# Patient Record
Sex: Male | Born: 1965 | Race: White | Hispanic: No | Marital: Single | Smoking: Current some day smoker
Health system: Southern US, Community
[De-identification: ages and names within clinical notes are randomized; demographics above are authoritative.]

## PROBLEM LIST (undated history)

## (undated) DIAGNOSIS — E7800 Pure hypercholesterolemia, unspecified: Secondary | ICD-10-CM

---

## 2015-11-05 ENCOUNTER — Emergency Department (HOSPITAL_COMMUNITY): Payer: BLUE CROSS/BLUE SHIELD

## 2015-11-05 ENCOUNTER — Encounter (HOSPITAL_COMMUNITY): Payer: Self-pay

## 2015-11-05 ENCOUNTER — Emergency Department (HOSPITAL_COMMUNITY)
Admission: EM | Admit: 2015-11-05 | Discharge: 2015-11-05 | Disposition: A | Discharge status: Home / Self Care | Payer: BLUE CROSS/BLUE SHIELD | Attending: Emergency Medicine | Admitting: Emergency Medicine

## 2015-11-05 DIAGNOSIS — R5383 Other fatigue: Secondary | ICD-10-CM | POA: Insufficient documentation

## 2015-11-05 DIAGNOSIS — E7800 Pure hypercholesterolemia, unspecified: Secondary | ICD-10-CM | POA: Diagnosis not present

## 2015-11-05 DIAGNOSIS — F17200 Nicotine dependence, unspecified, uncomplicated: Secondary | ICD-10-CM | POA: Diagnosis not present

## 2015-11-05 DIAGNOSIS — R945 Abnormal results of liver function studies: Secondary | ICD-10-CM

## 2015-11-05 DIAGNOSIS — R160 Hepatomegaly, not elsewhere classified: Secondary | ICD-10-CM

## 2015-11-05 DIAGNOSIS — Z79899 Other long term (current) drug therapy: Secondary | ICD-10-CM | POA: Insufficient documentation

## 2015-11-05 DIAGNOSIS — R1013 Epigastric pain: Secondary | ICD-10-CM | POA: Diagnosis present

## 2015-11-05 DIAGNOSIS — R7989 Other specified abnormal findings of blood chemistry: Secondary | ICD-10-CM

## 2015-11-05 HISTORY — DX: Pure hypercholesterolemia, unspecified: E78.00

## 2015-11-05 LAB — CBC WITH DIFFERENTIAL/PLATELET
Basophils Absolute: 0 10*3/uL (ref 0.0–0.1)
Basophils Relative: 1 %
Eosinophils Absolute: 0.1 10*3/uL (ref 0.0–0.7)
Eosinophils Relative: 1 %
HCT: 41.9 % (ref 39.0–52.0)
Hemoglobin: 13.9 g/dL (ref 13.0–17.0)
Lymphocytes Relative: 15 %
Lymphs Abs: 1 10*3/uL (ref 0.7–4.0)
MCH: 31.7 pg (ref 26.0–34.0)
MCHC: 33.2 g/dL (ref 30.0–36.0)
MCV: 95.4 fL (ref 78.0–100.0)
Monocytes Absolute: 0.3 10*3/uL (ref 0.1–1.0)
Monocytes Relative: 5 %
Neutro Abs: 5.2 10*3/uL (ref 1.7–7.7)
Neutrophils Relative %: 78 %
Platelets: 266 10*3/uL (ref 150–400)
RBC: 4.39 MIL/uL (ref 4.22–5.81)
RDW: 13.1 % (ref 11.5–15.5)
WBC: 6.6 10*3/uL (ref 4.0–10.5)

## 2015-11-05 LAB — PROTIME-INR
INR: 1.07 (ref 0.00–1.49)
Prothrombin Time: 14.1 seconds (ref 11.6–15.2)

## 2015-11-05 LAB — LIPASE, BLOOD: Lipase: 41 U/L (ref 11–51)

## 2015-11-05 LAB — COMPREHENSIVE METABOLIC PANEL
ALT: 118 U/L — ABNORMAL HIGH (ref 17–63)
AST: 153 U/L — ABNORMAL HIGH (ref 15–41)
Albumin: 3.2 g/dL — ABNORMAL LOW (ref 3.5–5.0)
Alkaline Phosphatase: 580 U/L — ABNORMAL HIGH (ref 38–126)
Anion gap: 13 (ref 5–15)
BUN: 10 mg/dL (ref 6–20)
CO2: 20 mmol/L — ABNORMAL LOW (ref 22–32)
Calcium: 9.4 mg/dL (ref 8.9–10.3)
Chloride: 103 mmol/L (ref 101–111)
Creatinine, Ser: 0.96 mg/dL (ref 0.61–1.24)
GFR calc Af Amer: >60 mL/min (ref >60)
GFR calc non Af Amer: >60 mL/min (ref >60)
Glucose, Bld: 98 mg/dL (ref 65–99)
Potassium: 4 mmol/L (ref 3.5–5.1)
Sodium: 136 mmol/L (ref 135–145)
Total Bilirubin: 12.6 mg/dL — ABNORMAL HIGH (ref 0.3–1.2)
Total Protein: 6.9 g/dL (ref 6.5–8.1)

## 2015-11-05 LAB — GAMMA GT: GGT: 717 U/L — ABNORMAL HIGH (ref 7–50)

## 2015-11-05 MED ORDER — IOHEXOL 300 MG/ML  SOLN
100.0000 mL | Freq: Once | INTRAMUSCULAR | Status: AC | PRN
  Administered 2015-11-05: 100 mL via INTRAVENOUS

## 2015-11-05 NOTE — ED Notes (Signed)
Patient transported to CT 

## 2015-11-05 NOTE — ED Notes (Addendum)
Pt presents from [place] with report of elevated liver enzymes.  Pt reports he went to pcp C/O generalized itching, no appetite and upper abdominal pain, reports dark urine.  Pt reports he was in a mud run x 2 weeks, was running and fell into a pummel horse; reports symptoms began after that.

## 2015-11-05 NOTE — ED Provider Notes (Signed)
CSN: [id number]     Arrival date & time 11/05/15  9518 History   First MD Initiated Contact with Patient 11/05/15 [phone]     Chief Complaint  Patient presents with  . Abdominal Pain     (Consider location/radiation/quality/duration/timing/severity/associated sxs/prior Treatment) HPI   49 year old male with abdominal pain and abnormal LFTs. For the past 5 days to week patient has not been feeling well. Fatigue. Diffuse pruritus. Evaluated by PCP who ordered blood work prior to pt leaving for trip to [facility]. Significant for abnormal LFTs. ALT/AST in 100s. Alkaline phosphatase 700. T bilirubin around 10. Patient is also noticed that his urine has been very dark in color. Mild nausea. No vomiting. No fevers or chills. Occasional alcohol use. No history of any hepatic or biliary issues that he is aware of. No weight loss but has had some night sweats the last 2 nights.  Past Medical History  Diagnosis Date  . Hypercholesteremia    History reviewed. No pertinent past surgical history. History reviewed. No pertinent family history. Social History  Substance Use Topics  . Smoking status: Current Some Day Smoker  . Smokeless tobacco: None  . Alcohol Use: None    Review of Systems  All systems reviewed and negative, other than as noted in HPI.   Allergies  Review of patient's allergies indicates no known allergies.  Home Medications   Prior to Admission medications   Medication Sig Start Date End Date Taking? Authorizing Provider  acetaminophen (TYLENOL) 500 MG tablet Take 1,000 mg by mouth every 6 (six) hours as needed for mild pain or headache.   Yes Historical Provider, MD  hydroxypropyl methylcellulose / hypromellose (ISOPTO TEARS / GONIOVISC) 2.5 % ophthalmic solution Place 2 drops into both eyes as needed for dry eyes.   Yes Historical Provider, MD  simvastatin (ZOCOR) 20 MG tablet Take 20 mg by mouth daily at 6 PM.  10/14/15  Yes Historical Provider, MD   BP 104/81 mmHg  Pulse 82   Temp(Src) 98.9 F (37.2 C) (Oral)  Resp 18  Ht  (1.854 m)  Wt 190 lb (86.183 kg)  BMI 25.07 kg/m2  SpO2 97% Physical Exam  Constitutional: He appears well-developed and well-nourished. No distress.  HENT:  Head: Normocephalic and atraumatic.  Eyes: Right eye exhibits no discharge. Left eye exhibits no discharge. Scleral icterus is present.  Neck: Neck supple.  Cardiovascular: Normal rate, regular rhythm and normal heart sounds.  Exam reveals no gallop and no friction rub.   No murmur heard. Pulmonary/Chest: Effort normal and breath sounds normal. No respiratory distress.  Abdominal: Soft. He exhibits no distension. There is tenderness.  Mild epigastric tenderness without rebound or guarding. Hepatomegaly  Musculoskeletal: He exhibits no edema or tenderness.  Neurological: He is alert.  Skin: Skin is warm and dry.  Psychiatric: He has a normal mood and affect. His behavior is normal. Thought content normal.  Nursing note and vitals reviewed.   ED Course  Procedures (including critical care time) Labs Review Labs Reviewed  COMPREHENSIVE METABOLIC PANEL - Abnormal; Notable for the following:    CO2 20 (*)    Albumin 3.2 (*)    AST 153 (*)    ALT 118 (*)    Alkaline Phosphatase 580 (*)    Total Bilirubin 12.6 (*)    All other components within normal limits  GAMMA GT - Abnormal; Notable for the following:    GGT 717 (*)    All other components within normal limits  LIPASE,  BLOOD  CBC WITH DIFFERENTIAL/PLATELET  PROTIME-INR  HEPATITIS PANEL, ACUTE    Imaging Review Ct Abdomen Pelvis W Contrast  11/05/2015  CLINICAL DATA:  Loss of appetite. Abdominal pain. Jaundice with dark urine EXAM: CT ABDOMEN AND PELVIS WITH CONTRAST TECHNIQUE: Multidetector CT imaging of the abdomen and pelvis was performed using the standard protocol following bolus administration of intravenous contrast. CONTRAST:  OMNIPAQUE IOHEXOL 300 MG/ML  SOLN COMPARISON:  None. FINDINGS: Lower chest:   Lung bases are clear. Hepatobiliary: Liver is prominent, measuring 23.3 cm in length. The liver as a somewhat nodular contour anteriorly, raising concern for underlying parenchymal liver disease such as a degree of cirrhosis. There are multiple masses throughout the liver. The largest conglomeration of mass occupies much of the anterior segment of the right lobe of the liver, measuring 12.4 x 11.8 cm. Smaller masses are noted throughout the left lobe with cystic areas near the dome of the liver. The largest of these more cystic-appearing areas measures 6.2 x 5.2 cm. Gallbladder is somewhat contracted. The gallbladder wall is not appreciably thickened. There is no biliary duct dilatation. Pancreas: No pancreatic mass or inflammatory focus. Spleen: No splenic lesions are identified. Adrenals/Urinary Tract: Right adrenal appears normal. There is a 1.2 x 0.8 cm mass in the left adrenal gland. Kidneys bilaterally show no mass or hydronephrosis on either side there is no renal or ureteral calculus on either side. Urinary bladder is midline with normal wall thickness. Stomach/Bowel: There are multiple sigmoid diverticula without diverticulitis. Scattered diverticula are noted elsewhere in the colon without associated inflammatory change. There is probable muscular hypertrophy from diverticulosis in portions of the sigmoid colon. There is no mesenteric thickening. No bowel obstruction. No free air or portal venous air. Vascular/Lymphatic: There is no abdominal aortic aneurysm. The major mesenteric arteries appear patent. There is no demonstrable adenopathy in the abdomen or pelvis. There are subcentimeter lymph nodes in the periportal region which most likely are secondary to the apparent parenchymal liver disease/ likely cirrhosis in the adjacent liver. Reproductive: There are prostatic calculi. Prostate is upper normal in size. There is no pelvic mass or pelvic fluid collection. Other: Appendix appears normal. No ascites  or abscess in the abdomen or pelvis. Musculoskeletal: There are no blastic or lytic bone lesions. There is no intramuscular or abdominal wall lesion. IMPRESSION: The contour of the liver suggests a degree of underlying parenchymal disease such as cirrhosis. Liver is somewhat prominent. There are multiple non- cystic mass lesions throughout much of the liver, consistent with either primary liver carcinoma or widespread metastatic disease. Cystic areas in the more superior aspect of the liver potentially may represent cystic metastases but also may represent simple cysts. Small left adrenal mass, statistically most likely an adrenal adenoma. A small metastatic focus is possible in this area. No adenopathy. Scattered colonic diverticula without diverticulitis. Slight colonic wall thickening in the sigmoid colon may be due to muscular hypertrophy from the sigmoid diverticulosis. There is no frank diverticulitis. These results were called by telephone at the time of interpretation on 11/05/2015 at 3:20 pm to Dr. Raeford Razor , who verbally acknowledged these results. Electronically Signed   By: Bretta Bang III M.D.   On: 11/05/2015 15:20   US Abdomen Limited  11/05/2015  CLINICAL DATA:  Epigastric pain.  Jaundice with abnormal LFT EXAM: US ABDOMEN LIMITED - RIGHT UPPER QUADRANT COMPARISON:  None. FINDINGS: Gallbladder: Gallbladder is contracted. No definite gallstones. Negative sonographic Murphy sign Common bile duct: Diameter: 4.1 mm Liver:  Liver is diffusely heterogeneous. Complex cyst left lobe liver measures 6 cm. Solid echogenic lesion left lobe liver measures 3 x 4 cm. Liver parenchyma is diffusely heterogeneous. No ascites in the right upper quadrant. IMPRESSION: Contracted gallbladder without definite gallstone or biliary dilatation Diffusely heterogeneous liver with cystic and solid lesions. Recommend follow-up CT abdomen with IV contrast for further evaluation Electronically Signed   By: Marlan Palauharles   Clark M.D.   On: 11/05/2015 11:59   I have personally reviewed and evaluated these images and lab results as part of my medical decision-making.   EKG Interpretation None      MDM   Final diagnoses:  Liver masses  Abnormal LFTs    49 year old male with epigastric pain and abnormal LFTs. Unfortunately, imaging significant for multiple hepatic cystic and noncystic masses which is most likely cancer.  Discussed with patient. LFTs as above. Patient is afebrile. Fairly minimal tenderness on exam. Can be further worked up as an outpatient with close follow-up. Patient is visiting from [place] and scheduled to return next week but says he will likely cut his trip short. Understands the need to follow-up as soon as he can with PCP and/or oncology.     Raeford RazorStephen Yuritza Paulhus, MD 11/05/15 [phone]

## 2015-11-06 LAB — HEPATITIS PANEL, ACUTE
HCV Ab: 0.1 s/co ratio (ref 0.0–0.9)
Hep A IgM: NEGATIVE
Hep B C IgM: NEGATIVE
Hepatitis B Surface Ag: NEGATIVE

## 2016-09-19 IMAGING — CT CT ABD-PELV W/ CM
2 of 5 series · 8 of 46 positions shown, 9 images · IV contrast (Iodine)
Comparison: None.

CLINICAL DATA: Loss of appetite. Abdominal pain. Jaundice with dark
urine

EXAM:
CT ABDOMEN AND PELVIS WITH CONTRAST
TECHNIQUE: Multidetector CT imaging of the abdomen and pelvis was performed
using the standard protocol following bolus administration of
intravenous contrast.
CONTRAST:  100mL OMNIPAQUE IOHEXOL 300 MG/ML  SOLN

[Series 201: routine, idose (2) · axial · 0.78mm/px · z∈[+77,+492]mm · 5 of 107 slices shown, 6 images]
[im 12/107  soft-tissue]
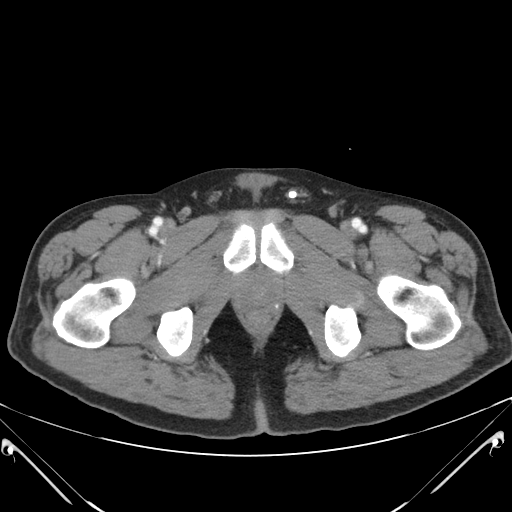
[im 12/107  bone]
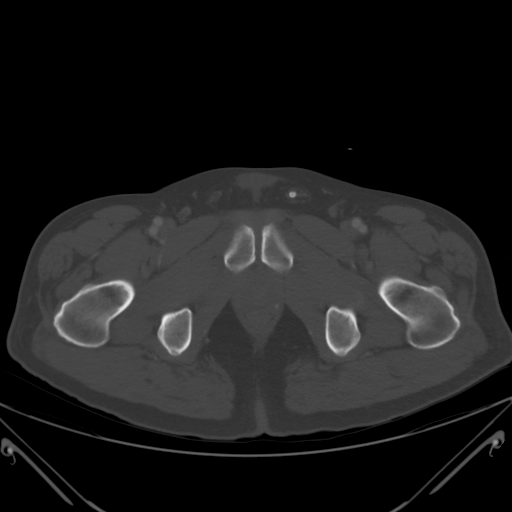
[im 34/107  soft-tissue]
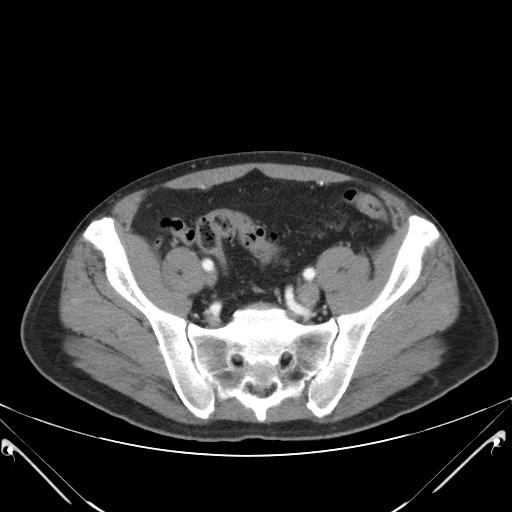
[im 56/107  soft-tissue]
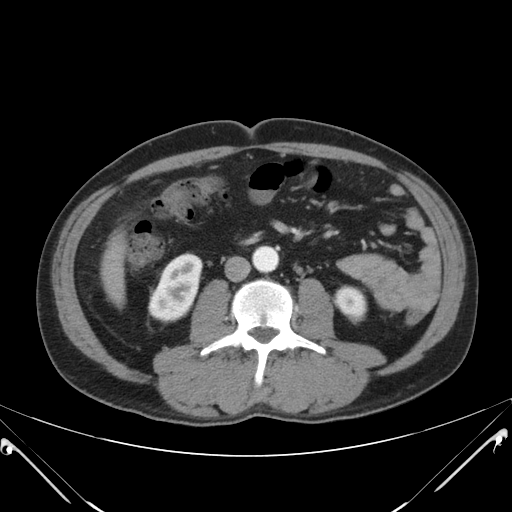
[im 73/107  soft-tissue]
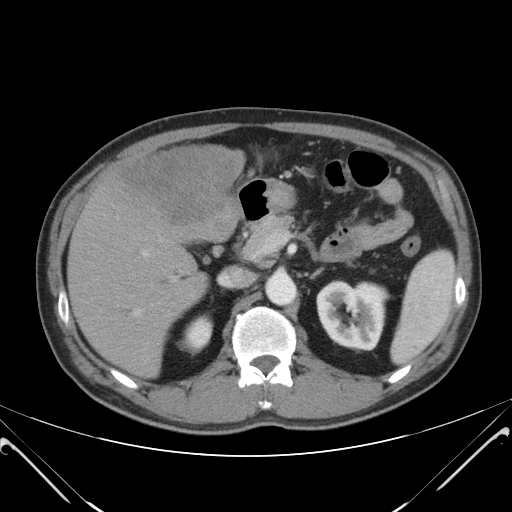
[im 95/107  soft-tissue]
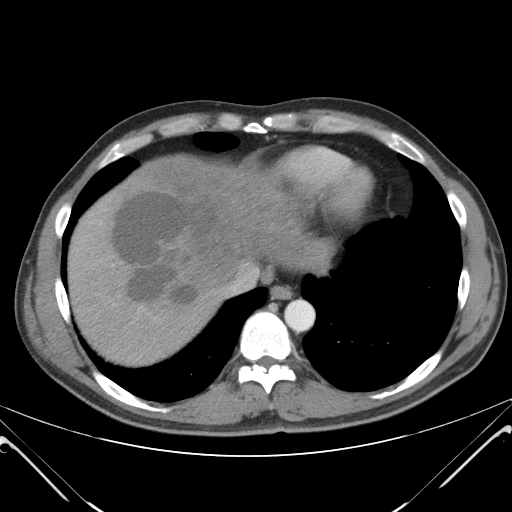

[Series 203: coronals, idose (2) · coronal · 0.45mm/px · 3 of 132 slices shown]
[im 44/132  soft-tissue]
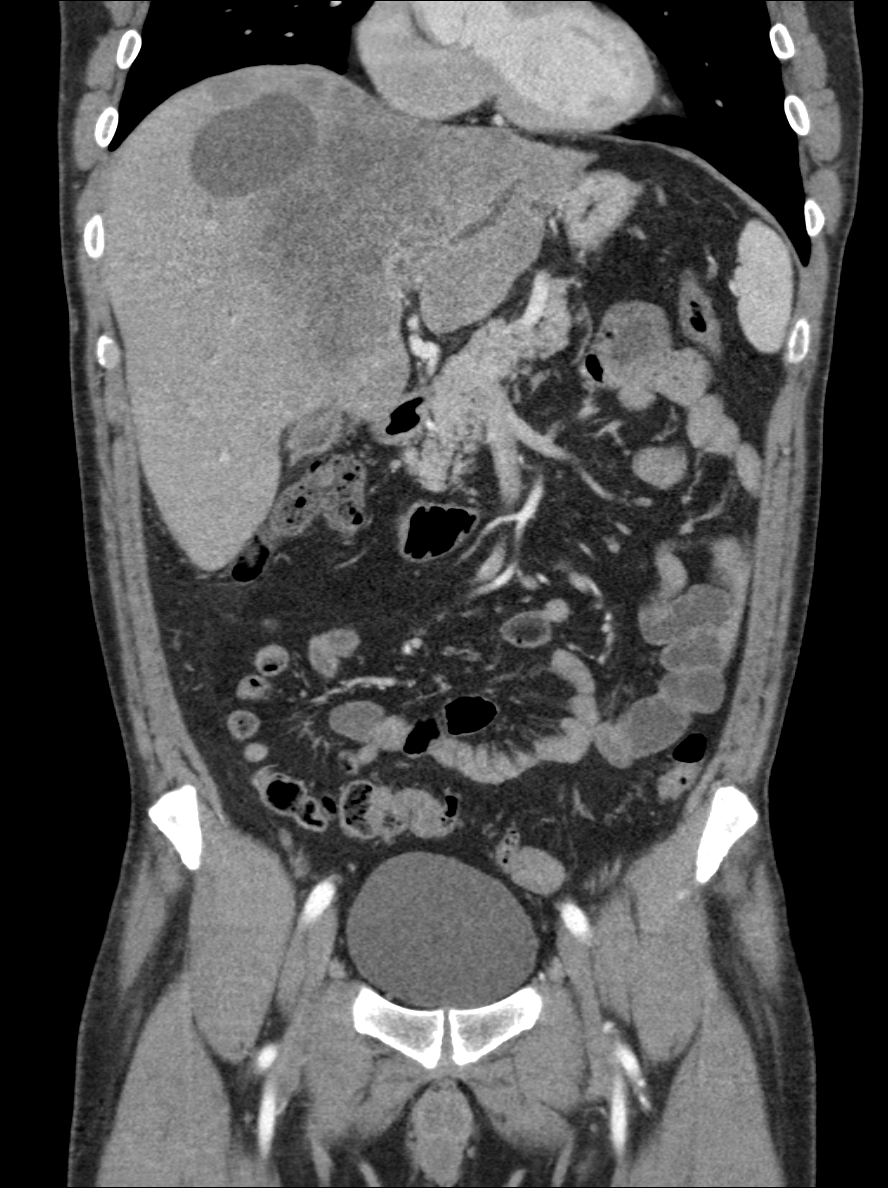
[im 59/132  soft-tissue]
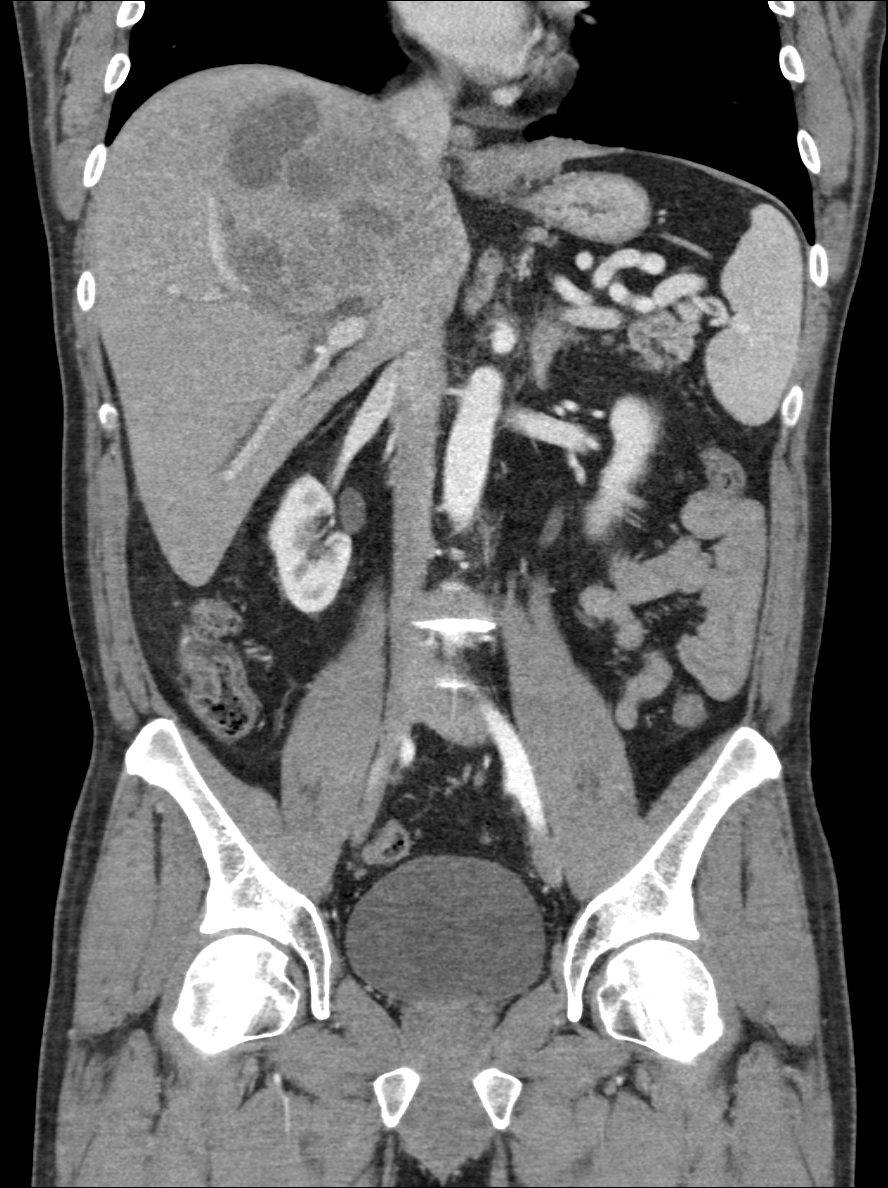
[im 73/132  soft-tissue]
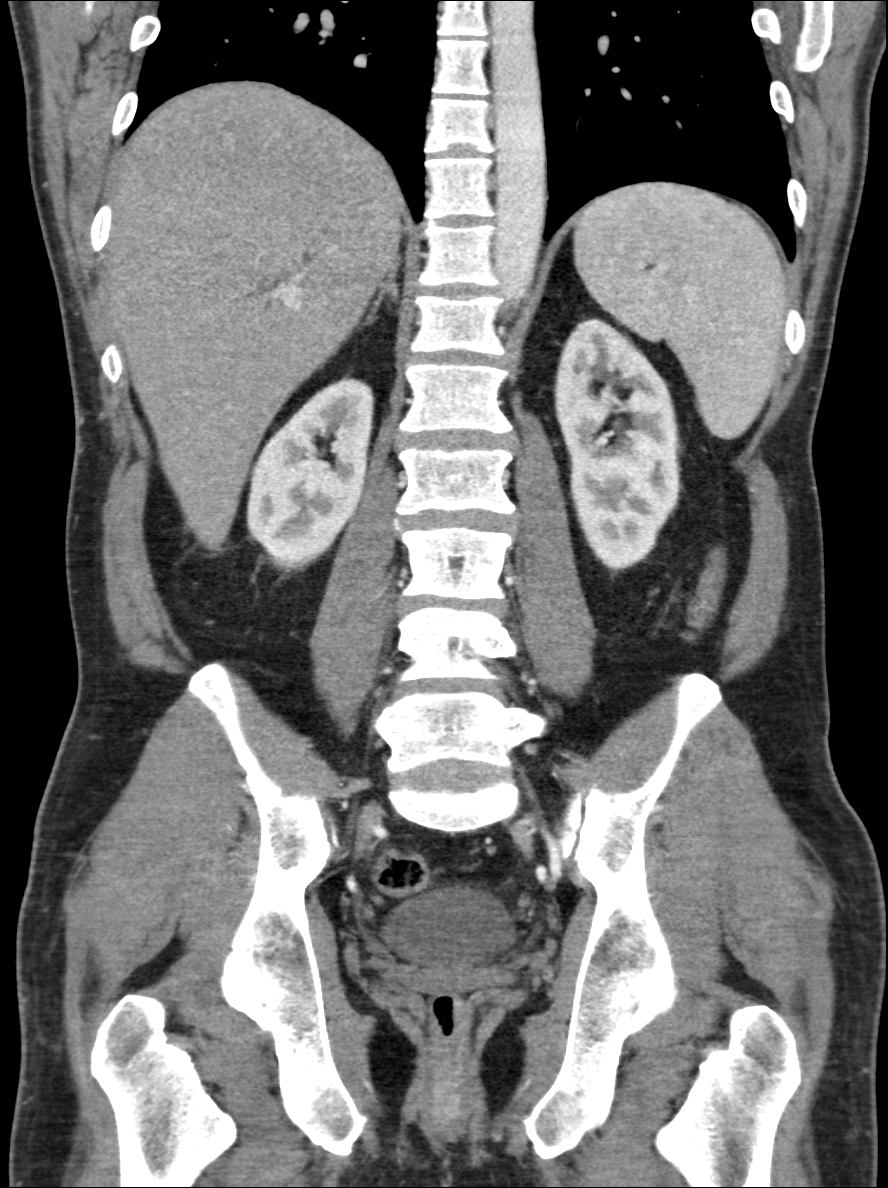

[8 of 46 positions shown; findings below may reference images not displayed]

FINDINGS: Lower chest:  Lung bases are clear.

Hepatobiliary: Liver is prominent, measuring 23.3 cm in length. The
liver as a somewhat nodular contour anteriorly, raising concern for
underlying parenchymal liver disease such as a degree of cirrhosis.
There are multiple masses throughout the liver. The largest
conglomeration of mass occupies much of the anterior segment of the
right lobe of the liver, measuring 12.4 x 11.8 cm. Smaller masses
are noted throughout the left lobe with cystic areas near the dome
of the liver. The largest of these more cystic-appearing areas
measures 6.2 x 5.2 cm.

Gallbladder is somewhat contracted. The gallbladder wall is not
appreciably thickened. There is no biliary duct dilatation.

Pancreas: No pancreatic mass or inflammatory focus.

Spleen: No splenic lesions are identified.

Adrenals/Urinary Tract: Right adrenal appears normal. There is a
x 0.8 cm mass in the left adrenal gland. Kidneys bilaterally show no
mass or hydronephrosis on either side there is no renal or ureteral
calculus on either side. Urinary bladder is midline with normal wall
thickness.

Stomach/Bowel: There are multiple sigmoid diverticula without
diverticulitis. Scattered diverticula are noted elsewhere in the
colon without associated inflammatory change. There is probable
muscular hypertrophy from diverticulosis in portions of the sigmoid
colon. There is no mesenteric thickening. No bowel obstruction. No
free air or portal venous air.

Vascular/Lymphatic: There is no abdominal aortic aneurysm. The major
mesenteric arteries appear patent. There is no demonstrable
adenopathy in the abdomen or pelvis. There are subcentimeter lymph
nodes in the periportal region which most likely are secondary to
the apparent parenchymal liver disease/ likely cirrhosis in the
adjacent liver.

Reproductive: There are prostatic calculi. Prostate is upper normal
in size. There is no pelvic mass or pelvic fluid collection.

Other: Appendix appears normal. No ascites or abscess in the abdomen
or pelvis.

Musculoskeletal: There are no blastic or lytic bone lesions. There
is no intramuscular or abdominal wall lesion.
IMPRESSION: The contour of the liver suggests a degree of underlying parenchymal
disease such as cirrhosis. Liver is somewhat prominent. There are
multiple non- cystic mass lesions throughout much of the liver,
consistent with either primary liver carcinoma or widespread
metastatic disease. Cystic areas in the more superior aspect of the
liver potentially may represent cystic metastases but also may
represent simple cysts.

Small left adrenal mass, statistically most likely an adrenal
adenoma. A small metastatic focus is possible in this area.

No adenopathy.

Scattered colonic diverticula without diverticulitis. Slight colonic
wall thickening in the sigmoid colon may be due to muscular
hypertrophy from the sigmoid diverticulosis. There is no frank
diverticulitis.

These results were called by telephone at the time of interpretation
on 11/05/2015 at [DATE] to Dr. PUI YUEN KAORU , who verbally
acknowledged these results.
